# Patient Record
Sex: Male | Born: 1965 | Race: White | Hispanic: No | Marital: Married | Smoking: Never smoker
Health system: Southern US, Community
[De-identification: ages and names within clinical notes are randomized; demographics above are authoritative.]

## PROBLEM LIST (undated history)

## (undated) DIAGNOSIS — Z8249 Family history of ischemic heart disease and other diseases of the circulatory system: Secondary | ICD-10-CM

## (undated) DIAGNOSIS — T7840XA Allergy, unspecified, initial encounter: Secondary | ICD-10-CM

## (undated) HISTORY — DX: Allergy, unspecified, initial encounter: T78.40XA

## (undated) HISTORY — PX: HERNIA REPAIR: SHX51

## (undated) HISTORY — DX: Family history of ischemic heart disease and other diseases of the circulatory system: Z82.49

---

## 2006-01-06 ENCOUNTER — Ambulatory Visit: Payer: Self-pay | Admitting: Family Medicine

## 2006-03-22 ENCOUNTER — Ambulatory Visit: Payer: Self-pay | Admitting: Family Medicine

## 2006-04-19 ENCOUNTER — Ambulatory Visit: Payer: Self-pay | Admitting: Family Medicine

## 2006-07-05 ENCOUNTER — Ambulatory Visit: Payer: Self-pay | Admitting: Family Medicine

## 2007-02-28 ENCOUNTER — Ambulatory Visit: Payer: Self-pay | Admitting: Family Medicine

## 2008-12-04 ENCOUNTER — Ambulatory Visit: Payer: Self-pay | Admitting: Family Medicine

## 2009-05-21 ENCOUNTER — Ambulatory Visit: Payer: Self-pay | Admitting: Family Medicine

## 2009-06-21 ENCOUNTER — Encounter: Admission: RE | Admit: 2009-06-21 | Discharge: 2009-06-21 | Payer: Self-pay | Admitting: Family Medicine

## 2009-06-21 ENCOUNTER — Ambulatory Visit: Payer: Self-pay | Admitting: Family Medicine

## 2009-12-20 ENCOUNTER — Ambulatory Visit: Payer: Self-pay | Admitting: Family Medicine

## 2010-05-07 ENCOUNTER — Ambulatory Visit: Payer: Self-pay | Admitting: Family Medicine

## 2010-10-09 ENCOUNTER — Ambulatory Visit: Payer: Self-pay | Admitting: Family Medicine

## 2010-10-13 ENCOUNTER — Ambulatory Visit: Payer: PRIVATE HEALTH INSURANCE | Admitting: Family Medicine

## 2011-01-14 IMAGING — CR DG FACIAL BONES COMPLETE 3+V
4 series · 4 of 4 positions shown · non-contrast
Comparison: None

CLINICAL DATA: Left facial pain following injury last night.

FACIAL BONES COMPLETE 3+V

[view not recorded (1 of 4)]
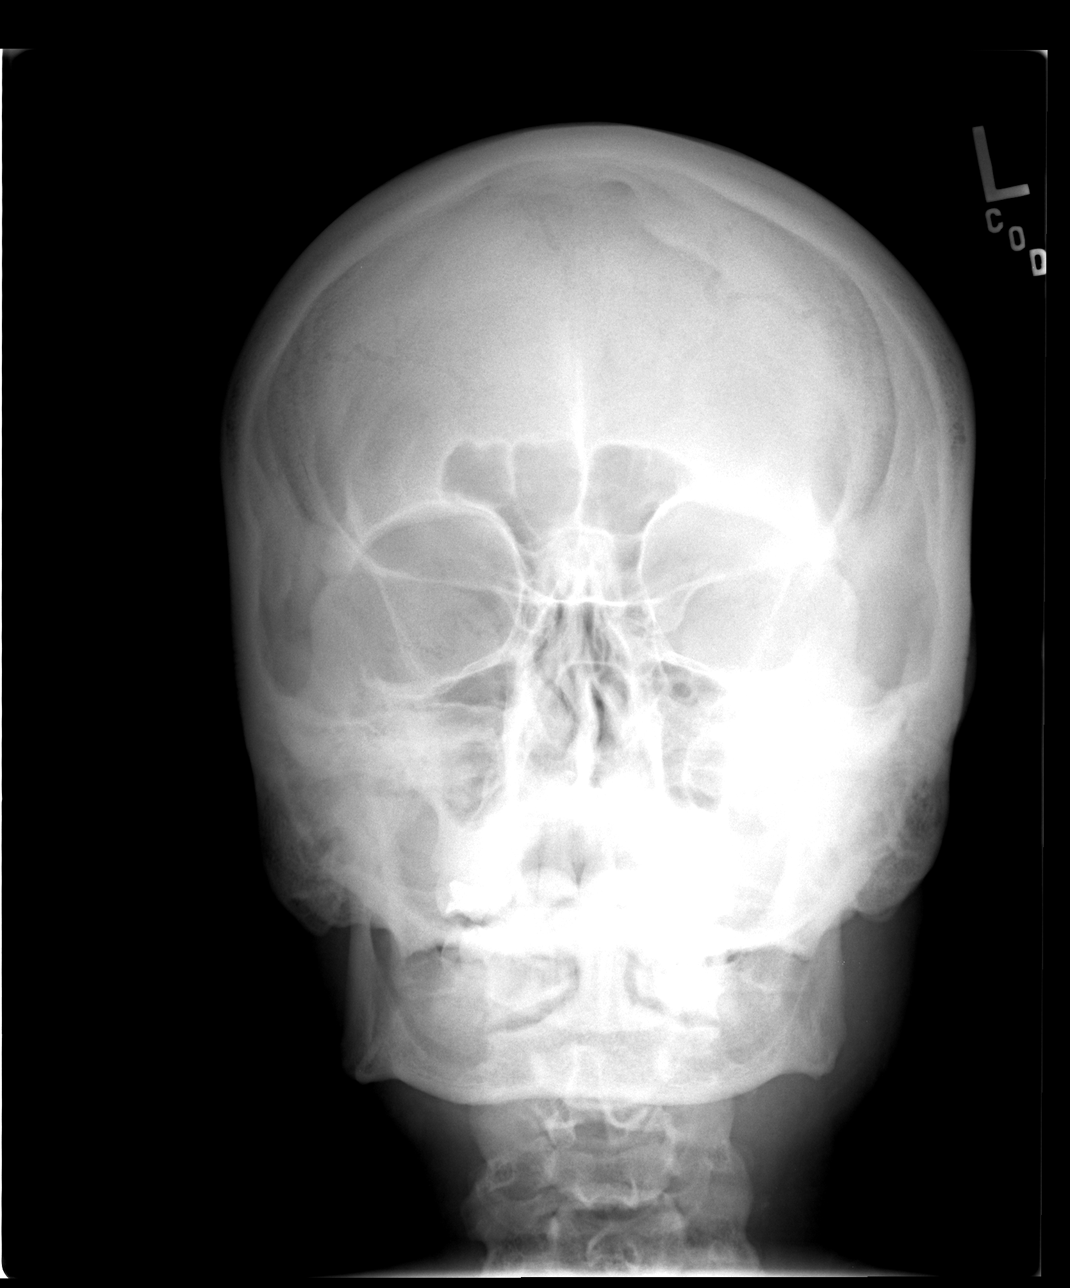

[view not recorded (2 of 4)]
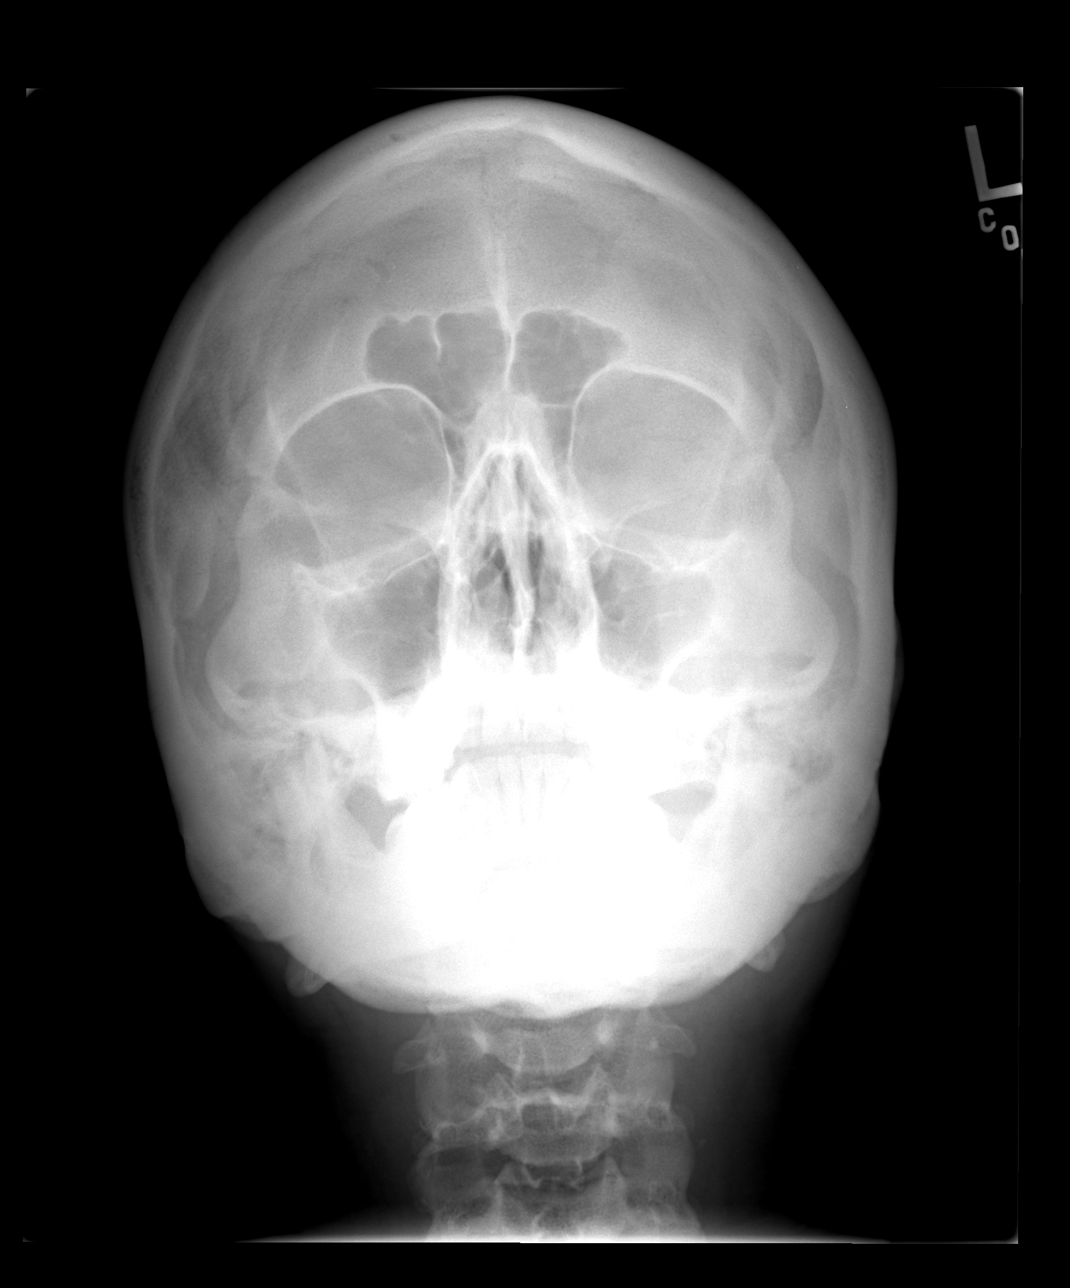

[view not recorded (3 of 4)]
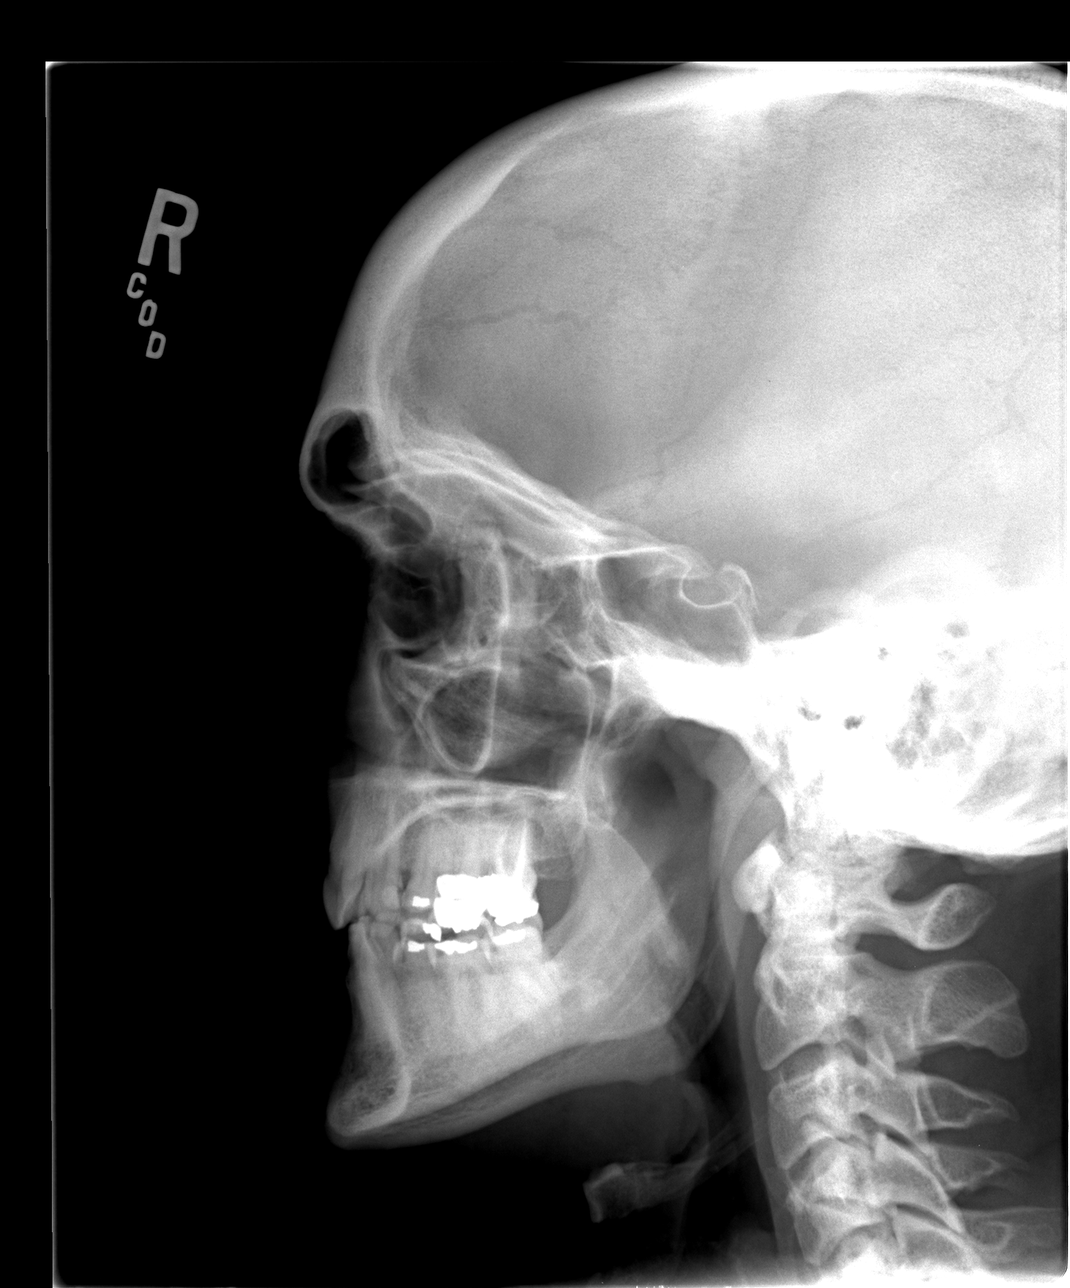

[view not recorded (4 of 4)]
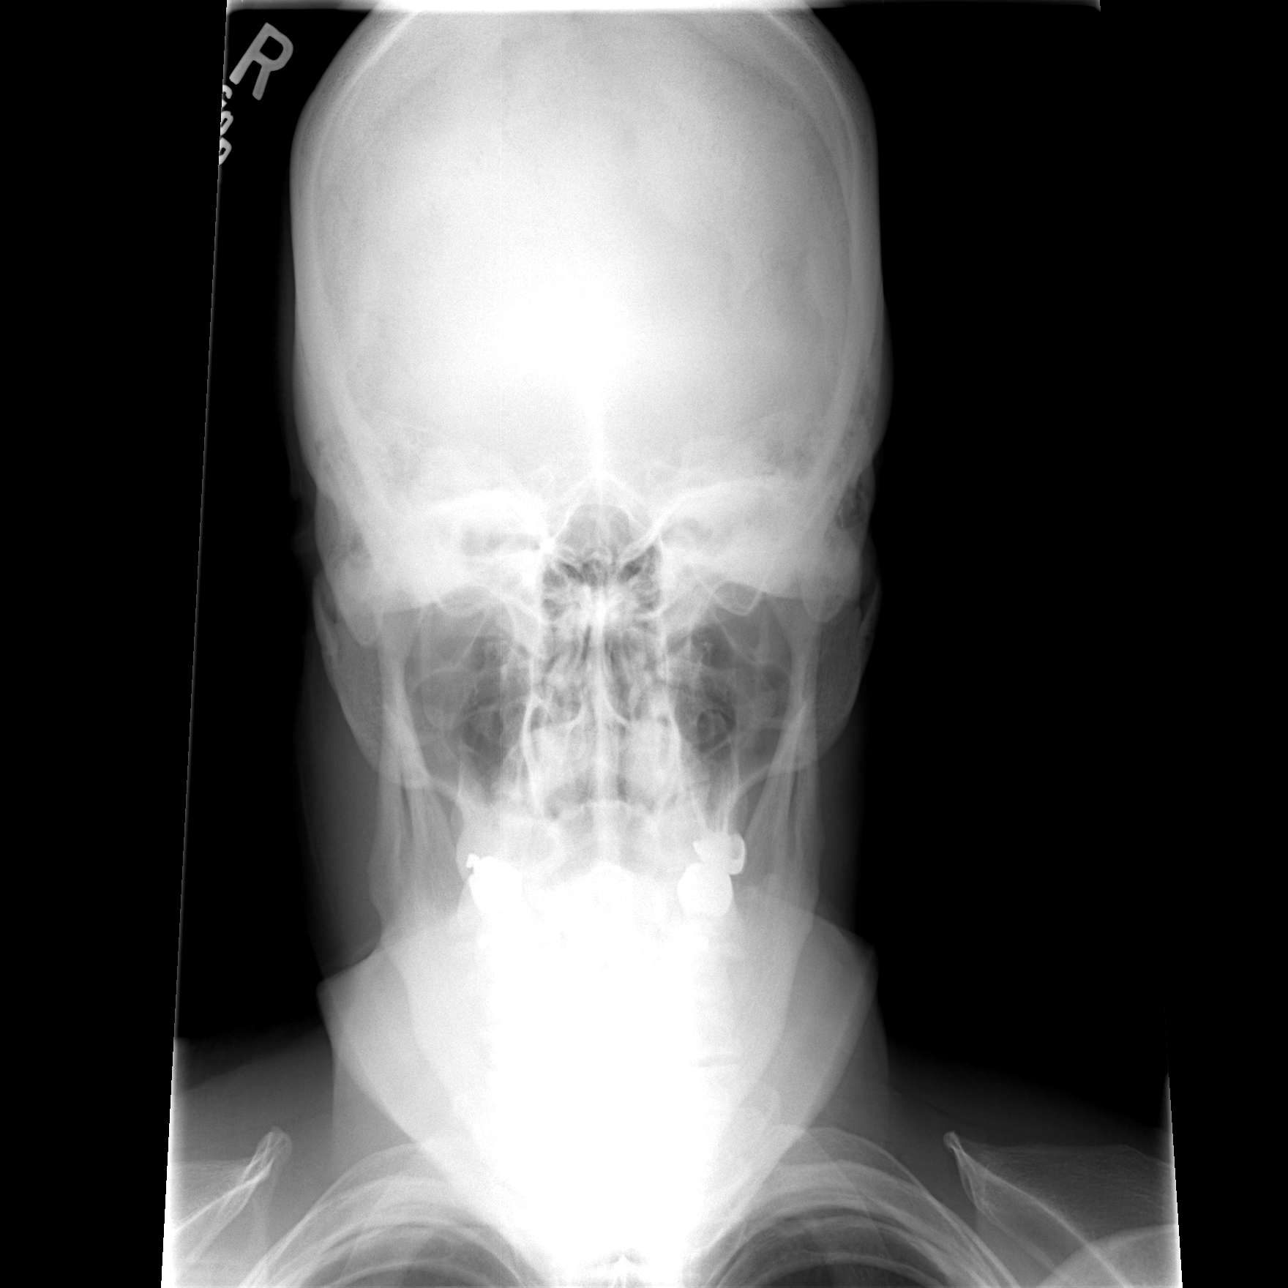

[4 of 4 positions shown; findings below may reference images not displayed]

FINDINGS: There is no evidence of displaced facial fracture.  The
visualized paranasal sinuses are clear without air fluid levels.
The nasal bones are not optimally evaluated by this technique.
IMPRESSION: No radiographic evidence of displaced facial fracture.

## 2011-03-18 ENCOUNTER — Encounter: Payer: Self-pay | Admitting: Family Medicine

## 2011-05-05 ENCOUNTER — Ambulatory Visit (INDEPENDENT_AMBULATORY_CARE_PROVIDER_SITE_OTHER): Payer: PRIVATE HEALTH INSURANCE | Admitting: Family Medicine

## 2011-05-05 ENCOUNTER — Encounter: Payer: Self-pay | Admitting: Family Medicine

## 2011-05-05 VITALS — BP 120/80 | HR 84 | Temp 97.5°F | Wt 177.0 lb

## 2011-05-05 DIAGNOSIS — J019 Acute sinusitis, unspecified: Secondary | ICD-10-CM

## 2011-05-05 DIAGNOSIS — D1739 Benign lipomatous neoplasm of skin and subcutaneous tissue of other sites: Secondary | ICD-10-CM

## 2011-05-05 DIAGNOSIS — D1720 Benign lipomatous neoplasm of skin and subcutaneous tissue of unspecified limb: Secondary | ICD-10-CM

## 2011-05-05 MED ORDER — AMOXICILLIN 875 MG PO TABS: 875.0000 mg | ORAL_TABLET | Freq: Two times a day (BID) | ORAL | Status: AC

## 2011-05-05 NOTE — Patient Instructions (Signed)
The lump on your shoulder is a lipoma. I can send you to a surgeon but would be trading a lump for scar Take the antibiotic and if not totally back to normal call me

## 2011-05-05 NOTE — Progress Notes (Signed)
  Subjective:    Patient ID: Roberto Stone, male    DOB: March 18, 1966, 45 y.o.   MRN: [id number]  HPI Two-week history of nasal congestion and has been using topical decongestant. He does have some purulent rhinorrhea but no upper tooth discomfort,, sore throat or earache. He continues to use Claritin-D. He does not smoke. He also has a lesion present on his right shoulder that he would like evaluated.   Review of Systems     Objective:   Physical Exam alert and in no distress. Tympanic membranes and canals are normal. Throat is clear. Tonsils are normal. Neck is supple without adenopathy or thyromegaly. Cardiac exam shows a regular sinus rhythm without murmurs or gallops. Lungs are clear to auscultation. Nasal mucosa is slightly red. Some tenderness over maxillary sinuses. A 4 cm round movable fluctuant lesion is noted on the right anterior shoulder       Assessment & Plan:   1. Sinusitis, acute   2. Lipoma of shoulder    Amoxil. He will call if not entirely better. No therapy for the shoulder lipoma.

## 2016-01-01 DIAGNOSIS — T7840XA Allergy, unspecified, initial encounter: Secondary | ICD-10-CM | POA: Diagnosis not present

## 2016-01-06 ENCOUNTER — Ambulatory Visit: Payer: Self-pay | Admitting: Family Medicine

## 2016-01-13 ENCOUNTER — Encounter: Payer: Self-pay | Admitting: Family Medicine

## 2016-01-13 ENCOUNTER — Ambulatory Visit (INDEPENDENT_AMBULATORY_CARE_PROVIDER_SITE_OTHER): Payer: BLUE CROSS/BLUE SHIELD | Admitting: Family Medicine

## 2016-01-13 VITALS — BP 110/70 | HR 93 | Ht 68.5 in | Wt 179.0 lb

## 2016-01-13 DIAGNOSIS — F1021 Alcohol dependence, in remission: Secondary | ICD-10-CM

## 2016-01-13 DIAGNOSIS — N500 Atrophy of testis: Secondary | ICD-10-CM

## 2016-01-13 DIAGNOSIS — Z0000 Encounter for general adult medical examination without abnormal findings: Secondary | ICD-10-CM

## 2016-01-13 DIAGNOSIS — Z8249 Family history of ischemic heart disease and other diseases of the circulatory system: Secondary | ICD-10-CM | POA: Diagnosis not present

## 2016-01-13 DIAGNOSIS — J3089 Other allergic rhinitis: Secondary | ICD-10-CM

## 2016-01-13 DIAGNOSIS — Z1211 Encounter for screening for malignant neoplasm of colon: Secondary | ICD-10-CM

## 2016-01-13 DIAGNOSIS — L509 Urticaria, unspecified: Secondary | ICD-10-CM | POA: Diagnosis not present

## 2016-01-13 LAB — CBC WITH DIFFERENTIAL/PLATELET
BASOS ABS: 0 {cells}/uL (ref 0–200)
Basophils Relative: 0 %
EOS ABS: 75 {cells}/uL (ref 15–500)
Eosinophils Relative: 1 %
HCT: 43.4 % (ref 38.5–50.0)
HEMOGLOBIN: 14.8 g/dL (ref 13.2–17.1)
LYMPHS ABS: 1650 {cells}/uL (ref 850–3900)
Lymphocytes Relative: 22 %
MCH: 29.4 pg (ref 27.0–33.0)
MCHC: 34.1 g/dL (ref 32.0–36.0)
MCV: 86.3 fL (ref 80.0–100.0)
MONOS PCT: 7 %
MPV: 9.6 fL (ref 7.5–12.5)
Monocytes Absolute: 525 cells/uL (ref 200–950)
NEUTROS ABS: 5250 {cells}/uL (ref 1500–7800)
NEUTROS PCT: 70 %
Platelets: 247 10*3/uL (ref 140–400)
RBC: 5.03 MIL/uL (ref 4.20–5.80)
RDW: 13.8 % (ref 11.0–15.0)
WBC: 7.5 10*3/uL (ref 4.0–10.5)

## 2016-01-13 LAB — COMPREHENSIVE METABOLIC PANEL
ALBUMIN: 4.4 g/dL (ref 3.6–5.1)
ALT: 20 U/L (ref 9–46)
AST: 20 U/L (ref 10–35)
Alkaline Phosphatase: 46 U/L (ref 40–115)
BUN: 12 mg/dL (ref 7–25)
CHLORIDE: 102 mmol/L (ref 98–110)
CO2: 26 mmol/L (ref 20–31)
Calcium: 9.4 mg/dL (ref 8.6–10.3)
Creat: 0.96 mg/dL (ref 0.70–1.33)
Glucose, Bld: 79 mg/dL (ref 65–99)
POTASSIUM: 4.1 mmol/L (ref 3.5–5.3)
Sodium: 139 mmol/L (ref 135–146)
TOTAL PROTEIN: 7.1 g/dL (ref 6.1–8.1)
Total Bilirubin: 0.7 mg/dL (ref 0.2–1.2)

## 2016-01-13 LAB — LIPID PANEL
CHOL/HDL RATIO: 2.7 ratio (ref <=5.0)
CHOLESTEROL: 192 mg/dL (ref 125–200)
HDL: 70 mg/dL (ref >=40)
LDL Cholesterol: 106 mg/dL (ref <130)
TRIGLYCERIDES: 79 mg/dL (ref <150)
VLDL: 16 mg/dL (ref <30)

## 2016-01-13 NOTE — Progress Notes (Signed)
Subjective:    Patient ID: Roberto Stone, male    DOB: 1965-09-09, 50 y.o.   MRN: [id number]  HPI Is here for examination. He recently turned 50 years old. He has had intermittent difficulty with hives in the last several months. He cannot relate this to foods, allergens, chemicals or irritants of any kind. He has had no insect bites that he knows of. Usually it shows up with an itching rash but on 2 occasions he has noticed some swelling of the lips. He does have difficulty with nasal congestion and snoring and does find Claritin-D to be helpful. 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 He states that he did this on his own and not through a rehabilitation program. He continues to go to [employer] and finds them to be helpful. Family history significant for father having CABG graft at age 41. No sexual or urologic difficulties. Otherwise family and social history as well as health maintenance and immunizations were reviewed. His work and home life are going well. Runs 3 or 4 days per week.   Review of Systems  All other systems reviewed and are negative.      Objective:   Physical Exam BP 110/70 mmHg  Pulse 93  Ht 5' 8.5" (1.74 m)  Wt 179 lb (81.194 kg)  BMI 26.82 kg/m2  SpO2 99%  General Appearance:    Alert, cooperative, no distress, appears stated age  Head:    Normocephalic, without obvious abnormality, atraumatic  Eyes:    PERRL, conjunctiva/corneas clear, EOM's intact, fundi    benign  Ears:    Normal TM's and external ear canals  Nose:   Nares normal, mucosa normal, no drainage or sinus   tenderness  Throat:   Lips, mucosa, and tongue normal; teeth and gums normal  Neck:   Supple, no lymphadenopathy;  thyroid:  no   enlargement/tenderness/nodules; no carotid   bruit or JVD  Back:    Spine nontender, no curvature, ROM normal, no CVA     tenderness  Lungs:     Clear to auscultation bilaterally without wheezes, rales or     ronchi; respirations unlabored  Chest Wall:    No  tenderness or deformity   Heart:    Regular rate and rhythm, S1 and S2 normal, no murmur, rub   or gallop  Breast Exam:    No chest wall tenderness, masses or gynecomastia  Abdomen:     Soft, non-tender, nondistended, normoactive bowel sounds,    no masses, no hepatosplenomegaly  Genitalia:    Normal male external genitalia without lesions.  Testicles without masses but atrophied bilaterally.  No inguinal hernias.  Rectal:    Normal sphincter tone, no masses or tenderness; guaiac negative stool.  Prostate smooth, no nodules, not enlarged.  Extremities:   No clubbing, cyanosis or edema  Pulses:   2+ and symmetric all extremities  Skin:   Skin color, texture, turgor normal, no rashes or lesions  Lymph nodes:   Cervical, supraclavicular, and axillary nodes normal  Neurologic:   CNII-XII intact, normal strength, sensation and gait; reflexes 2+ and symmetric throughout          Psych:   Normal mood, affect, hygiene and grooming.          Assessment & Plan:  Routine general medical examination at a health care facility - Plan: CBC with Differential/Platelet, Comprehensive metabolic panel, Lipid panel  Family history of heart disease  Other allergic rhinitis  Screening for colon  cancer - Plan: Ambulatory referral to Gastroenterology  Recovering alcoholic in remission [facility]) Encouraged regular physical activity and in fact he says he is getting ready for a half marathon. He will continue on the Claritin-D. He is to keep track of his allergies and see if there is a pattern. If continued difficulty, referral to allergy will be made.

## 2016-02-14 ENCOUNTER — Encounter: Payer: Self-pay | Admitting: Gastroenterology

## 2016-05-01 ENCOUNTER — Encounter: Payer: PRIVATE HEALTH INSURANCE | Admitting: Gastroenterology

## 2016-06-08 ENCOUNTER — Ambulatory Visit (AMBULATORY_SURGERY_CENTER): Payer: Self-pay | Admitting: *Deleted

## 2016-06-08 VITALS — Ht 68.5 in | Wt 179.0 lb

## 2016-06-08 DIAGNOSIS — Z1211 Encounter for screening for malignant neoplasm of colon: Secondary | ICD-10-CM

## 2016-06-08 MED ORDER — NA SULFATE-K SULFATE-MG SULF 17.5-3.13-1.6 GM/177ML PO SOLN
ORAL | 0 refills | Status: DC
Start: 2016-06-08 — End: 2016-06-24

## 2016-06-12 ENCOUNTER — Encounter: Payer: Self-pay | Admitting: Gastroenterology

## 2016-06-24 ENCOUNTER — Ambulatory Visit (AMBULATORY_SURGERY_CENTER): Payer: BLUE CROSS/BLUE SHIELD | Admitting: Gastroenterology

## 2016-06-24 ENCOUNTER — Encounter: Payer: Self-pay | Admitting: Gastroenterology

## 2016-06-24 VITALS — BP 107/68 | HR 75 | Temp 97.7°F | Resp 16 | Ht 68.0 in | Wt 179.0 lb

## 2016-06-24 DIAGNOSIS — Z1212 Encounter for screening for malignant neoplasm of rectum: Secondary | ICD-10-CM

## 2016-06-24 DIAGNOSIS — Z1211 Encounter for screening for malignant neoplasm of colon: Secondary | ICD-10-CM | POA: Diagnosis not present

## 2016-06-24 MED ORDER — SODIUM CHLORIDE 0.9 % IV SOLN: 500.0000 mL | INTRAVENOUS | Status: AC

## 2016-06-24 NOTE — Patient Instructions (Signed)
Impression/recommendations:  Hemorrhoids (handout given)  Repeat colonoscopy in 10 years.  YOU HAD AN ENDOSCOPIC PROCEDURE TODAY AT [facility]:   Refer to the procedure report that was given to you for any specific questions about what was found during the examination.  If the procedure report does not answer your questions, please call your gastroenterologist to clarify.  If you requested that your care partner not be given the details of your procedure findings, then the procedure report has been included in a sealed envelope for you to review at your convenience later.  YOU SHOULD EXPECT: Some feelings of bloating in the abdomen. Passage of more gas than usual.  Walking can help get rid of the air that was put into your GI tract during the procedure and reduce the bloating. If you had a lower endoscopy (such as a colonoscopy or flexible sigmoidoscopy) you may notice spotting of blood in your stool or on the toilet paper. If you underwent a bowel prep for your procedure, you may not have a normal bowel movement for a few days.  Please Note:  You might notice some irritation and congestion in your nose or some drainage.  This is from the oxygen used during your procedure.  There is no need for concern and it should clear up in a day or so.  SYMPTOMS TO REPORT IMMEDIATELY:   Following lower endoscopy (colonoscopy or flexible sigmoidoscopy):  Excessive amounts of blood in the stool  Significant tenderness or worsening of abdominal pains  Swelling of the abdomen that is new, acute  Fever of 100F or higher  For urgent or emergent issues, a gastroenterologist can be reached at any hour by calling [phone].   DIET:  We do recommend a small meal at first, but then you may proceed to your regular diet.  Drink plenty of fluids but you should avoid alcoholic beverages for 24 hours.  ACTIVITY:  You should plan to take it easy for the rest of today and you should NOT DRIVE or  use heavy machinery until tomorrow (because of the sedation medicines used during the test).    FOLLOW UP: Our staff will call the number listed on your records the next business day following your procedure to check on you and address any questions or concerns that you may have regarding the information given to you following your procedure. If we do not reach you, we will leave a message.  However, if you are feeling well and you are not experiencing any problems, there is no need to return our call.  We will assume that you have returned to your regular daily activities without incident.  If any biopsies were taken you will be contacted by phone or by letter within the next 1-3 weeks.  Please call us at [phone] if you have not heard about the biopsies in 3 weeks.    SIGNATURES/CONFIDENTIALITY: You and/or your care partner have signed paperwork which will be entered into your electronic medical record.  These signatures attest to the fact that that the information above on your After Visit Summary has been reviewed and is understood.  Full responsibility of the confidentiality of this discharge information lies with you and/or your care-partner.

## 2016-06-24 NOTE — Op Note (Signed)
[place] Patient Name: Roberto Stone Procedure Date: 06/24/2016 7:55 AM MRN: [id number] Endoscopist: Remo Lipps P. Nguyen Todorov MD, MD Age: 50 Referring MD:  Date of Birth: 11-Jan-1966 Gender: Male Account #: [id number] Procedure:                Colonoscopy Indications:              Screening for malignant neoplasm in the colon, This                            is the patient's first colonoscopy Medicines:                Monitored Anesthesia Care Procedure:                Pre-Anesthesia Assessment:                           - Prior to the procedure, a History and Physical                            was performed, and patient medications and                            allergies were reviewed. The patient's tolerance of                            previous anesthesia was also reviewed. The risks                            and benefits of the procedure and the sedation                            options and risks were discussed with the patient.                            All questions were answered, and informed consent                            was obtained. Prior Anticoagulants: The patient has                            taken no previous anticoagulant or antiplatelet                            agents. ASA Grade Assessment: II - A patient with                            mild systemic disease. After reviewing the risks                            and benefits, the patient was deemed in                            satisfactory condition to undergo the procedure.  After obtaining informed consent, the colonoscope                            was passed under direct vision. Throughout the                            procedure, the patient's blood pressure, pulse, and                            oxygen saturations were monitored continuously. The                            Model CF-HQ190L [id number]) scope was introduced                            through the anus  and advanced to the the cecum,                            identified by appendiceal orifice and ileocecal                            valve. The colonoscopy was performed without                            difficulty. The patient tolerated the procedure                            well. The quality of the bowel preparation was                            good. The ileocecal valve, appendiceal orifice, and                            rectum were photographed. Scope In: 7:58:21 AM Scope Out: 8:13:35 AM Scope Withdrawal Time: 0 hours 12 minutes 44 seconds  Total Procedure Duration: 0 hours 15 minutes 14 seconds  Findings:                 The perianal and digital rectal examinations were                            normal.                           Non-bleeding internal hemorrhoids were found during                            retroflexion.                           The exam was otherwise without abnormality. No                            polyps appreciated. Complications:            No immediate complications. Estimated blood loss:  None. Estimated Blood Loss:     Estimated blood loss: none. Impression:               - Non-bleeding internal hemorrhoids.                           - The examination was otherwise normal. Recommendation:           - Patient has a contact number available for                            emergencies. The signs and symptoms of potential                            delayed complications were discussed with the                            patient. Return to normal activities tomorrow.                            Written discharge instructions were provided to the                            patient.                           - Resume previous diet.                           - Continue present medications.                           - Repeat colonoscopy in 10 years for screening                            purposes. Remo Lipps P. Acelynn Dejonge MD,  MD 06/24/2016 8:16:19 AM This report has been signed electronically.

## 2016-06-25 ENCOUNTER — Telehealth: Payer: Self-pay

## 2016-06-25 NOTE — Telephone Encounter (Signed)
  Follow up Call-  Call back number 06/24/2016  Post procedure Call Back phone  # [phone]  Permission to leave phone message Yes  Some recent data might be hidden    Patient was called for follow up after his procedure on 06/24/2016. No answer at the number given for follow up phone call. A message was left on the answering machine.

## 2016-06-25 NOTE — Telephone Encounter (Signed)
  Follow up Call-  Call back number 06/24/2016  Post procedure Call Back phone  # [phone]  Permission to leave phone message Yes  Some recent data might be hidden    Patient was called for follow up after his procedure on 06/24/2016. No answer at the number given for follow up phone call. A message was left on the answering machine,

## 2017-03-01 ENCOUNTER — Encounter: Payer: Self-pay | Admitting: Family Medicine

## 2017-03-01 ENCOUNTER — Ambulatory Visit (INDEPENDENT_AMBULATORY_CARE_PROVIDER_SITE_OTHER): Payer: BLUE CROSS/BLUE SHIELD | Admitting: Family Medicine

## 2017-03-01 VITALS — BP 110/68 | HR 88 | Wt 172.0 lb

## 2017-03-01 DIAGNOSIS — M7582 Other shoulder lesions, left shoulder: Secondary | ICD-10-CM | POA: Diagnosis not present

## 2017-03-01 MED ORDER — LIDOCAINE HCL 2 % IJ SOLN
3.0000 mL | Freq: Once | INTRAMUSCULAR | Status: AC
  Administered 2017-03-01: 60 mg via INTRADERMAL

## 2017-03-01 MED ORDER — TRIAMCINOLONE ACETONIDE 40 MG/ML IJ SUSP
40.0000 mg | Freq: Once | INTRAMUSCULAR | Status: AC
  Administered 2017-03-01: 40 mg via INTRAMUSCULAR

## 2017-03-01 NOTE — Progress Notes (Signed)
   Subjective:    Patient ID: Roberto Stone, male    DOB: 06/30/1966, 51 y.o.   MRN: [id number]  HPI He is here for evaluation of left shoulder pain. He started working out the gym doing weights and several months ago noted some left shoulder pain especially with abduction as well as internal and Chol rotation. He did take Aleve regularly and stopped exercising however when he started up again, symptoms reoccurred.   Review of Systems     Objective:   Physical Exam Full motion of the shoulder. No palpable tenderness over the before meals joint or bicipital groove. Negative sulcus sign. Neer's and Hawkins test was uncomfortable. Supraspinatus testing negative.       Assessment & Plan:  Tendinitis of left rotator cuff - Plan: Ambulatory referral to Physical Therapy, lidocaine (XYLOCAINE) 2 % (with pres) injection 60 mg, triamcinolone acetonide (KENALOG-40) injection 40 mg  I explained that he has treated this conservatively with minimal relief and suggested an injection which she accepted. The shoulder was prepped with Betadine in the posterolateral area and 40 mg of Kenalog and 3 mL of Xylocaine was injected into the subacromial bursa without difficulty. He did obtain relatively quickly. I will refer to physical therapy to teach proper technique for weight training.

## 2017-03-15 DIAGNOSIS — M542 Cervicalgia: Secondary | ICD-10-CM | POA: Diagnosis not present

## 2017-03-15 DIAGNOSIS — M25512 Pain in left shoulder: Secondary | ICD-10-CM | POA: Diagnosis not present

## 2017-03-15 DIAGNOSIS — M6281 Muscle weakness (generalized): Secondary | ICD-10-CM | POA: Diagnosis not present

## 2017-09-16 ENCOUNTER — Telehealth: Payer: Self-pay | Admitting: Family Medicine

## 2017-09-16 NOTE — Telephone Encounter (Signed)
Pt called requesting a script for Claritin D be sent to [place] at [facility], [place], [place]

## 2017-09-20 ENCOUNTER — Other Ambulatory Visit: Payer: Self-pay

## 2017-09-20 MED ORDER — LORATADINE-PSEUDOEPHEDRINE ER 10-240 MG PO TB24: 1.0000 | ORAL_TABLET | Freq: Every day | ORAL | 0 refills | Status: AC

## 2017-09-20 NOTE — Telephone Encounter (Signed)
Done KH 

## 2018-08-23 DIAGNOSIS — J019 Acute sinusitis, unspecified: Secondary | ICD-10-CM | POA: Diagnosis not present

## 2019-02-07 DIAGNOSIS — R0981 Nasal congestion: Secondary | ICD-10-CM | POA: Diagnosis not present

## 2021-10-13 ENCOUNTER — Encounter: Payer: Self-pay | Admitting: Family Medicine

## 2022-04-01 ENCOUNTER — Encounter: Payer: Self-pay | Admitting: Internal Medicine

## 2022-05-05 ENCOUNTER — Encounter: Payer: Self-pay | Admitting: Internal Medicine

## 2022-05-18 ENCOUNTER — Encounter: Payer: Self-pay | Admitting: Internal Medicine

## 2023-09-21 ENCOUNTER — Encounter: Payer: Self-pay | Admitting: Internal Medicine
